# Patient Record
Sex: Female | Born: 2000 | Race: White | Hispanic: No | Marital: Single | Smoking: Never smoker
Health system: Southern US, Community
[De-identification: ages and names within clinical notes are randomized; demographics above are authoritative.]

## PROBLEM LIST (undated history)

## (undated) DIAGNOSIS — L309 Dermatitis, unspecified: Secondary | ICD-10-CM

## (undated) HISTORY — DX: Dermatitis, unspecified: L30.9

---
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
## 2018-12-18 ENCOUNTER — Ambulatory Visit: Payer: BC Managed Care – PPO | Admitting: Orthopedic Surgery

## 2020-09-29 ENCOUNTER — Ambulatory Visit (INDEPENDENT_AMBULATORY_CARE_PROVIDER_SITE_OTHER): Payer: BC Managed Care – PPO | Admitting: Allergy

## 2020-09-29 ENCOUNTER — Encounter: Payer: Self-pay | Admitting: Allergy

## 2020-09-29 ENCOUNTER — Other Ambulatory Visit: Payer: Self-pay

## 2020-09-29 VITALS — BP 122/82 | HR 105 | Temp 98.2°F | Ht 64.0 in | Wt 146.5 lb

## 2020-09-29 DIAGNOSIS — J3089 Other allergic rhinitis: Secondary | ICD-10-CM | POA: Diagnosis not present

## 2020-09-29 DIAGNOSIS — L2089 Other atopic dermatitis: Secondary | ICD-10-CM

## 2020-09-29 DIAGNOSIS — R12 Heartburn: Secondary | ICD-10-CM | POA: Diagnosis not present

## 2020-09-29 DIAGNOSIS — J45990 Exercise induced bronchospasm: Secondary | ICD-10-CM | POA: Diagnosis not present

## 2020-09-29 DIAGNOSIS — R21 Rash and other nonspecific skin eruption: Secondary | ICD-10-CM | POA: Diagnosis not present

## 2020-09-29 MED ORDER — CETIRIZINE HCL 10 MG PO TABS: 10.0000 mg | ORAL_TABLET | Freq: Every day | ORAL | 5 refills | Status: AC

## 2020-09-29 NOTE — Assessment & Plan Note (Signed)
Mild flares on antecubital fossa area at times.  Start proper skin care.

## 2020-09-29 NOTE — Assessment & Plan Note (Signed)
Rhinoconjunctivitis symptoms during the change of seasons for many years.  No prior allergy evaluation.  Today's skin prick testing showed: Positive to grass, ragweed, mold, dust mite, dog.  Borderline to weed pollen.    Will get bloodwork instead of intradermals due to her sensitive skin.  Start environmental control measures as below.  Use over the counter antihistamines such as Zyrtec (cetirizine), Claritin (loratadine), Allegra (fexofenadine), or Xyzal (levocetirizine) daily as needed. May take twice a day during allergy flares. May switch antihistamines every few months.

## 2020-09-29 NOTE — Patient Instructions (Addendum)
Today's skin testing showed: Positive to grass, ragweed, mold, dust mite, dog.  Borderline to weed pollen.  Negative to common foods. Results given.  Rash/itching: See below for proper skin care. Start zyrtec (cetirizine) 10mg  once a day. If symptoms are not controlled or causes drowsiness let know. Avoid the following potential triggers: alcohol, tight clothing, NSAIDs, hot showers and getting overheated. Get bloodwork:  We are ordering labs, so please allow 1-2 weeks for the results to come back. With the newly implemented Cures Act, the labs might be visible to you at the same time that they become visible to me. However, I will not address the results until all of the results are back, so please be patient.   Environmental allergies Start environmental control measures as below. Use over the counter antihistamines such as Zyrtec (cetirizine), Claritin (loratadine), Allegra (fexofenadine), or Xyzal (levocetirizine) daily as needed. May take twice a day during allergy flares. May switch antihistamines every few months.  Asthma May use albuterol rescue inhaler 2 puffs every 4 to 6 hours as needed for shortness of breath, chest tightness, coughing, and wheezing. May use albuterol rescue inhaler 2 puffs 5 to 15 minutes prior to strenuous physical activities. Monitor frequency of use.   Follow up in 2 months or sooner if needed.   Skin care recommendations  Bath time: Always use lukewarm water. AVOID very hot or cold water. Keep bathing time to 5-10 minutes. Do NOT use bubble bath. Use a mild soap and use just enough to wash the dirty areas. Do NOT scrub skin vigorously.  After bathing, pat dry your skin with a towel. Do NOT rub or scrub the skin.  Moisturizers and prescriptions:  ALWAYS apply moisturizers immediately after bathing (within 3 minutes). This helps to lock-in moisture. Use the moisturizer several times a day over the whole body. Good summer moisturizers include:  Aveeno, CeraVe, Cetaphil. Good winter moisturizers include: Aquaphor, Vaseline, Cerave, Cetaphil, Eucerin, Vanicream. When using moisturizers along with medications, the moisturizer should be applied about one hour after applying the medication to prevent diluting effect of the medication or moisturize around where you applied the medications. When not using medications, the moisturizer can be continued twice daily as maintenance.  Laundry and clothing: Avoid laundry products with added color or perfumes. Use unscented hypo-allergenic laundry products such as Tide free, Cheer free & gentle, and All free and clear.  If the skin still seems dry or sensitive, you can try double-rinsing the clothes. Avoid tight or scratchy clothing such as wool. Do not use fabric softeners or dyer sheets.  Reducing Pollen Exposure Pollen seasons: trees (spring), grass (summer) and ragweed/weeds (fall). Keep windows closed in your home and car to lower pollen exposure.  Install air conditioning in the bedroom and throughout the house if possible.  Avoid going out in dry windy days - especially early morning. Pollen counts are highest between 5 - 10 AM and on dry, hot and windy days.  Save outside activities for late afternoon or after a heavy rain, when pollen levels are lower.  Avoid mowing of grass if you have grass pollen allergy. Be aware that pollen can also be transported indoors on people and pets.  Dry your clothes in an automatic dryer rather than hanging them outside where they might collect pollen.  Rinse hair and eyes before bedtime.  Pet Allergen Avoidance: Contrary to popular opinion, there are no "hypoallergenic" breeds of dogs or cats. That is because people are not allergic to an animal's  hair, but to an allergen found in the animal's saliva, dander (dead skin flakes) or urine. Pet allergy symptoms typically occur within minutes. For some people, symptoms can build up and become most severe 8 to  12 hours after contact with the animal. People with severe allergies can experience reactions in public places if dander has been transported on the pet owners' clothing. Keeping an animal outdoors is only a partial solution, since homes with pets in the yard still have higher concentrations of animal allergens. Before getting a pet, ask your allergist to determine if you are allergic to animals. If your pet is already considered part of your family, try to minimize contact and keep the pet out of the bedroom and other rooms where you spend a great deal of time. As with dust mites, vacuum carpets often or replace carpet with a hardwood floor, tile or linoleum. High-efficiency particulate air (HEPA) cleaners can reduce allergen levels over time. While dander and saliva are the source of cat and dog allergens, urine is the source of allergens from rabbits, hamsters, mice and [place] pigs; so ask a non-allergic family member to clean the animal's cage. If you have a pet allergy, talk to your allergist about the potential for allergy immunotherapy (allergy shots). This strategy can often provide long-term relief. Control of House Dust Mite Allergen Dust mite allergens are a common trigger of allergy and asthma symptoms. While they can be found throughout the house, these microscopic creatures thrive in warm, humid environments such as bedding, upholstered furniture and carpeting. Because so much time is spent in the bedroom, it is essential to reduce mite levels there.  Encase pillows, mattresses, and box springs in special allergen-proof fabric covers or airtight, zippered plastic covers.  Bedding should be washed weekly in hot water (130 F) and dried in a hot dryer. Allergen-proof covers are available for comforters and pillows that can't be regularly washed.  Wash the allergy-proof covers every few months. Minimize clutter in the bedroom. Keep pets out of the bedroom.  Keep humidity less than 50% by  using a dehumidifier or air conditioning. You can buy a humidity measuring device called a hygrometer to monitor this.  If possible, replace carpets with hardwood, linoleum, or washable area rugs. If that's not possible, vacuum frequently with a vacuum that has a HEPA filter. Remove all upholstered furniture and non-washable window drapes from the bedroom. Remove all non-washable stuffed toys from the bedroom.  Wash stuffed toys weekly. Mold Control Mold and fungi can grow on a variety of surfaces provided certain temperature and moisture conditions exist.  Outdoor molds grow on plants, decaying vegetation and soil. The major outdoor mold, Alternaria and Cladosporium, are found in very high numbers during hot and dry conditions. Generally, a late summer - fall peak is seen for common outdoor fungal spores. Rain will temporarily lower outdoor mold spore count, but counts rise rapidly when the rainy period ends. The most important indoor molds are Aspergillus and Penicillium. Dark, humid and poorly ventilated basements are ideal sites for mold growth. The next most common sites of mold growth are the bathroom and the kitchen. Outdoor (Seasonal) Mold Control Use air conditioning and keep windows closed. Avoid exposure to decaying vegetation. Avoid leaf raking. Avoid grain handling. Consider wearing a face mask if working in moldy areas.  Indoor (Perennial) Mold Control  Maintain humidity below 50%. Get rid of mold growth on hard surfaces with water, detergent and, if necessary, 5% bleach (do not mix with  other cleaners). Then dry the area completely. If mold covers an area more than 10 square feet, consider hiring an indoor environmental professional. For clothing, washing with soap and water is best. If moldy items cannot be cleaned and dried, throw them away. Remove sources e.g. contaminated carpets. Repair and seal leaking roofs or pipes. Using dehumidifiers in damp basements may be helpful, but  empty the water and clean units regularly to prevent mildew from forming. All rooms, especially basements, bathrooms and kitchens, require ventilation and cleaning to deter mold and mildew growth. Avoid carpeting on concrete or damp floors, and storing items in damp areas.

## 2020-09-29 NOTE — Assessment & Plan Note (Addendum)
Exercise-induced asthma for 5 years.  Uses albuterol on rare occasions with good benefit.  Had COVID-19 in February 2022.  May use albuterol rescue inhaler 2 puffs every 4 to 6 hours as needed for shortness of breath, chest tightness, coughing, and wheezing. May use albuterol rescue inhaler 2 puffs 5 to 15 minutes prior to strenuous physical activities. Monitor frequency of use.

## 2020-09-29 NOTE — Assessment & Plan Note (Addendum)
Initially started with pruritus for 1 year and then noticed rash the past 3 months. Sometimes worse when around certain dogs and scratching the area. Benadryl helps.  . Today's skin prick testing showed: Positive to grass, ragweed, mold, dust mite, dog.  Borderline to weed pollen.  Negative to common foods. Patient has sensitive skin.   Based on results and timeline, possible that her new dog is contributing to her symptoms.  See below for proper skin care.  Start zyrtec (cetirizine) 10mg  once a day.  If symptoms are not controlled or causes drowsiness let know. . Avoid the following potential triggers: alcohol, tight clothing, NSAIDs, hot showers and getting overheated. . Get bloodwork to rule out other etiologies.

## 2020-09-29 NOTE — Progress Notes (Signed)
New Patient Note  RE: Shelia Hayes MRN: [id number] DOB: 2000-06-28 Date of Office Visit: 09/29/2020  Consult requested by: Shelia QuillBryan, Daniel J, PA-C Primary care provider: Etta QuillBryan, Daniel J, PA-C  Chief Complaint: Establish Care, Allergy Testing, and Urticaria  History of Present Illness: I had the pleasure of seeing Shelia Hayes for initial evaluation at the [facility] on 09/29/2020. She is a 20 y.o. female, who is self-referred here for the evaluation of itching and rash.  Patient has been itching for about 1 year which can occur anywhere on her body. She noticed then breaking out in rashes if she strokes her skin and at a times with no physical pressure the last 3 months.   Describes them as itchy, red, raised. Individual rashes lasts about 15-20 minutes. No ecchymosis upon resolution. Associated symptoms include: none. Suspected triggers are her brother's dog possibly. Denies any fevers, chills, changes in medications, foods, personal care products or recent infections. She has tried the following therapies: benadryl with good benefit. Currently on no daily meds.  Previous work up includes: no. Previous history of rash/hives: not sure.  Assessment and Plan: [name] is a 20 y.o. female with: Rash and other nonspecific skin eruption Initially started with pruritus for 1 year and then noticed rash the past 3 months. Sometimes worse when around certain dogs and scratching the area. Benadryl helps.  Today's skin prick testing showed: Positive to grass, ragweed, mold, dust mite, dog.  Borderline to weed pollen.  Negative to common foods. Patient has sensitive skin.  Based on results and timeline, possible that her new dog is contributing to her symptoms. See below for proper skin care. Start zyrtec (cetirizine) 10mg  once a day. If symptoms are not controlled or causes drowsiness let us know. Avoid the following potential triggers: alcohol, tight clothing, NSAIDs, hot  showers and getting overheated. Get bloodwork to rule out other etiologies.   Other allergic rhinitis Rhinoconjunctivitis symptoms during the change of seasons for many years.  No prior allergy evaluation. Today's skin prick testing showed: Positive to grass, ragweed, mold, dust mite, dog.  Borderline to weed pollen.   Will get bloodwork instead of intradermals due to her sensitive skin. Start environmental control measures as below. Use over the counter antihistamines such as Zyrtec (cetirizine), Claritin (loratadine), Allegra (fexofenadine), or Xyzal (levocetirizine) daily as needed. May take twice a day during allergy flares. May switch antihistamines every few months.  Mild exercise-induced asthma Exercise-induced asthma for 5 years.  Uses albuterol on rare occasions with good benefit.  Had COVID-19 in February 2022. May use albuterol rescue inhaler 2 puffs every 4 to 6 hours as needed for shortness of breath, chest tightness, coughing, and wheezing. May use albuterol rescue inhaler 2 puffs 5 to 15 minutes prior to strenuous physical activities. Monitor frequency of use.   Other atopic dermatitis Mild flares on antecubital fossa area at times. Start proper skin care.  Return in about 2 months (around 11/29/2020).  Meds ordered this encounter  Medications   cetirizine (ZYRTEC) 10 MG tablet    Sig: Take 1 tablet (10 mg total) by mouth daily.    Dispense:  30 tablet    Refill:  5    Lab Orders         Chronic Urticaria         CBC with Differential/Platelet         Allergens w/Total IgE Area 2         Alpha-Gal Panel  Comprehensive metabolic panel         Thyroid Cascade Profile         Tryptase         Sedimentation rate         ANA w/Reflex      Other allergy screening: Asthma: yes She reports symptoms of chest tightness, shortness of breath, coughing, wheezing for 5 years. Current medications include albuterol prn which help. She reports not using aerochamber with  inhalers. She tried the following inhalers: none. Main triggers are exercise. In the last month, frequency of symptoms: <1x/week. Frequency of nocturnal symptoms: 0x/month. Frequency of SABA use: <1x/week. Interference with physical activity: sometimes. Sleep is undisturbed. In the last 12 months, emergency room visits/urgent care visits/doctor office visits or hospitalizations due to respiratory issues: no. In the last 12 months, oral steroids courses: no. Lifetime history of hospitalization for respiratory issues: no. Prior intubations: no. History of pneumonia: no. She was not evaluated by allergist/pulmonologist in the past. Smoking exposure: no. Up to date with flu vaccine: no. Up to date with COVID-19 vaccine: no. Prior Covid-19 infection: February 2022. History of reflux: yes and does not take medications for this  Rhino conjunctivitis: yes She reports symptoms of itchy eyes, nasal congestion, rhinorrhea, sneezing. Symptoms have been going on for many years but worsening. The symptoms are present mainly during change of seasons. Other triggers include exposure to certain dog. Headache: no. She has used benadryl prn with fair improvement in symptoms. Sinus infections: no. Previous work up includes: none. Previous ENT evaluation: no. Last eye exam: In June 2022.  Food allergy: no Medication allergy: no Hymenoptera allergy: no Eczema:yes rarely on the antecubital fossa area.  History of recurrent infections suggestive of immunodeficency: no  Diagnostics: Skin Testing: Environmental allergy panel and select foods. Positive to grass, ragweed, mold, dust mite, dog.  Borderline to weed pollen.  Negative to common foods. Results discussed with patient/family.  Airborne Adult Perc - 09/29/20 0945     Time Antigen Placed 0945    Allergen Manufacturer Waynette Buttery    Location Back    Number of Test 59    Panel 1 Select    1. Control-Buffer 50% Glycerol Negative    2. Control-Histamine 1 mg/ml 2+     3. Albumin saline Negative    4. Bahia Negative    5. [place] 2+    6. Johnson 2+    7. Kentucky Blue 2+    8. Meadow Fescue Negative    9. Perennial Rye Negative    10. Sweet Vernal Negative    11. Timothy Negative    12. Cocklebur Negative    13. Burweed Marshelder Negative    14. Ragweed, short 2+    15. Ragweed, Giant 2+    16. Plantain,  English --   +/-   17. Lamb's Quarters --   +/-   18. Sheep Sorrell --   +/-   19. Rough Pigweed Negative    20. [place] Circle, Rough --   +/-   21. Mugwort, Common Negative    22. Ash mix Negative    23. Birch mix Negative    24. Beech American Negative    25. Box, Elder Negative    26. Cedar, red Negative    27. Cottonwood, Guinea-Bissau Negative    28. Elm mix Negative    29. Hickory Negative    30. Maple mix Negative    31. Oak, [place] mix Negative    32. Pecan  Pollen Negative    33. Pine mix Negative    34. Sycamore Eastern Negative    35. Walnut, Black Pollen Negative    36. Alternaria alternata Negative    37. Cladosporium Herbarum Negative    38. Aspergillus mix Negative    39. Penicillium mix Negative    40. Bipolaris sorokiniana (Helminthosporium) Negative    41. Drechslera spicifera (Curvularia) Negative    42. Mucor plumbeus Negative    43. Fusarium moniliforme Negative    44. Aureobasidium pullulans (pullulara) --   +/-   45. Rhizopus oryzae Negative    46. Botrytis cinera 2+    47. Epicoccum nigrum Negative    48. Phoma betae Negative    49. Candida Albicans 2+    50. Trichophyton mentagrophytes Negative    51. Mite, D Farinae  5,000 AU/ml Negative    52. Mite, D Pteronyssinus  5,000 AU/ml 2+    53. Cat Hair 10,000 BAU/ml Negative    54.  Dog Epithelia 2+    55. Mixed Feathers Negative    56. Horse Epithelia Negative    57. Cockroach, German Negative    58. Mouse Negative    59. Tobacco Leaf Negative             Food Perc - 09/29/20 0946       Test Information   Time Antigen Placed 1610    Allergen  Manufacturer Waynette Buttery    Location Back    Number of allergen test 10    Food Select      Food   1. Peanut Negative    2. Soybean food Negative    3. Wheat, whole Negative    4. Sesame Negative    5. Milk, cow Negative    6. Egg White, chicken Negative    7. Casein Negative    8. Shellfish mix Negative    9. Fish mix Negative    10. Cashew Negative             Past Medical History: Patient Active Problem List   Diagnosis Date Noted   Rash and other nonspecific skin eruption 09/29/2020   Other allergic rhinitis 09/29/2020   Mild exercise-induced asthma 09/29/2020   Other atopic dermatitis 09/29/2020    Past Medical History:  Diagnosis Date   Eczema    Past Surgical History: History reviewed. No pertinent surgical history. Medication List:  Current Outpatient Medications  Medication Sig Dispense Refill   cetirizine (ZYRTEC) 10 MG tablet Take 1 tablet (10 mg total) by mouth daily. 30 tablet 5   albuterol (VENTOLIN HFA) 108 (90 Base) MCG/ACT inhaler USE 2 INHALATIONS PRIOR TO EXERCISE AS DIRECTED (Patient not taking: Reported on 09/29/2020)     No current facility-administered medications for this visit.   Allergies: Not on File Social History: Social History   Socioeconomic History   Marital status: Single    Spouse name: Not on file   Number of children: Not on file   Years of education: Not on file   Highest education level: Not on file  Occupational History   Not on file  Tobacco Use   Smoking status: Never   Smokeless tobacco: Never  Substance and Sexual Activity   Alcohol use: Not on file   Drug use: Not on file   Sexual activity: Not on file  Other Topics Concern   Not on file  Social History Narrative   Not on file   Social Determinants of Health   Financial  Resource Strain: Not on file  Food Insecurity: Not on file  Transportation Needs: Not on file  Physical Activity: Not on file  Stress: Not on file  Social Connections: Not on file    Lives in a house. Smoking: denies. Occupation: daycare.  Environmental History: Water Damage/mildew in the house: no Carpet in the family room: no Carpet in the bedroom: no Heating: electric Cooling: central Pet: yes 1 dog x 8 months  Family History: Family History  Problem Relation Age of Onset   Asthma Mother    Eczema Mother    Eczema Sister    Asthma Brother    Review of Systems  Constitutional:  Negative for appetite change, chills, fever and unexpected weight change.  HENT:  Negative for congestion and rhinorrhea.   Eyes:  Negative for itching.  Respiratory:  Negative for cough, chest tightness, shortness of breath and wheezing.   Cardiovascular:  Negative for chest pain.  Gastrointestinal:  Negative for abdominal pain.  Genitourinary:  Negative for difficulty urinating.  Skin:  Positive for rash.  Allergic/Immunologic: Positive for environmental allergies. Negative for food allergies.  Neurological:  Negative for headaches.   Objective: BP 122/82   Pulse (!) 105   Temp 98.2 F (36.8 C) (Temporal)   Ht 5\' 4"  (1.626 m)   Wt 146 lb 8 oz (66.5 kg)   SpO2 98%   BMI 25.15 kg/m  Body mass index is 25.15 kg/m. Physical Exam Vitals and nursing note reviewed.  Constitutional:      Appearance: Normal appearance. She is well-developed.  HENT:     Head: Normocephalic and atraumatic.     Right Ear: Tympanic membrane and external ear normal.     Left Ear: Tympanic membrane and external ear normal.     Ears:     Comments: Right helix area - keloid appearing growth, apparently there for 2 years now.    Nose: Nose normal.     Mouth/Throat:     Mouth: Mucous membranes are moist.     Pharynx: Oropharynx is clear.  Eyes:     Conjunctiva/sclera: Conjunctivae normal.  Cardiovascular:     Rate and Rhythm: Normal rate and regular rhythm.     Heart sounds: Normal heart sounds. No murmur heard.   No friction rub. No gallop.  Pulmonary:     Effort: Pulmonary effort is  normal.     Breath sounds: Normal breath sounds. No wheezing, rhonchi or rales.  Musculoskeletal:     Cervical back: Neck supple.  Skin:    General: Skin is warm.     Findings: No rash.     Comments: No dermatographism on exam  Neurological:     Mental Status: She is alert and oriented to person, place, and time.  Psychiatric:        Behavior: Behavior normal.  The plan was reviewed with the patient/family, and all questions/concerned were addressed.  It was my pleasure to see Olimpia today and participate in her care. Please feel free to contact me with any questions or concerns.  Sincerely,  Lisabeth Pick, DO Allergy & Immunology  [facility] office: [phone] [facility] office: [phone]

## 2020-10-08 LAB — ALLERGENS W/TOTAL IGE AREA 2

## 2020-10-08 LAB — CBC WITH DIFFERENTIAL/PLATELET
Basophils Absolute: 0.1 10*3/uL (ref 0.0–0.2)
Basos: 1 %
EOS (ABSOLUTE): 0.1 10*3/uL (ref 0.0–0.4)
Eos: 2 %
Hematocrit: 52 % — ABNORMAL HIGH (ref 34.0–46.6)
Hemoglobin: 16.5 g/dL — ABNORMAL HIGH (ref 11.1–15.9)
Immature Grans (Abs): 0 10*3/uL (ref 0.0–0.1)
Immature Granulocytes: 0 %
Lymphocytes Absolute: 1.7 10*3/uL (ref 0.7–3.1)
Lymphs: 29 %
MCH: 27 pg (ref 26.6–33.0)
MCHC: 31.7 g/dL (ref 31.5–35.7)
MCV: 85 fL (ref 79–97)
Monocytes Absolute: 0.3 10*3/uL (ref 0.1–0.9)
Monocytes: 5 %
Neutrophils Absolute: 3.5 10*3/uL (ref 1.4–7.0)
Neutrophils: 63 %
Platelets: 305 10*3/uL (ref 150–450)
RBC: 6.12 x10E6/uL — ABNORMAL HIGH (ref 3.77–5.28)
RDW: 13.2 % (ref 11.7–15.4)
WBC: 5.6 10*3/uL (ref 3.4–10.8)

## 2020-10-08 LAB — COMPREHENSIVE METABOLIC PANEL
ALT: 10 IU/L (ref 0–32)
AST: 11 IU/L (ref 0–40)
Albumin/Globulin Ratio: 2.2 (ref 1.2–2.2)
Albumin: 5.1 g/dL — ABNORMAL HIGH (ref 3.9–5.0)
Alkaline Phosphatase: 100 IU/L (ref 44–121)
BUN/Creatinine Ratio: 15 (ref 9–23)
BUN: 10 mg/dL (ref 6–20)
Bilirubin Total: 0.2 mg/dL (ref 0.0–1.2)
CO2: 24 mmol/L (ref 20–29)
Calcium: 10.1 mg/dL (ref 8.7–10.2)
Chloride: 103 mmol/L (ref 96–106)
Creatinine, Ser: 0.66 mg/dL (ref 0.57–1.00)
Globulin, Total: 2.3 g/dL (ref 1.5–4.5)
Glucose: 100 mg/dL — ABNORMAL HIGH (ref 65–99)
Potassium: 4.4 mmol/L (ref 3.5–5.2)
Sodium: 143 mmol/L (ref 134–144)
Total Protein: 7.4 g/dL (ref 6.0–8.5)
eGFR: 128 mL/min/{1.73_m2} (ref >59)

## 2020-10-08 LAB — THYROID CASCADE PROFILE: TSH: 1.52 u[IU]/mL (ref 0.450–4.500)

## 2020-10-08 LAB — ANA W/REFLEX: Anti Nuclear Antibody (ANA): NEGATIVE

## 2020-10-08 LAB — ALPHA-GAL PANEL
Allergen Lamb IgE: <0.1 kU/L
Beef IgE: <0.1 kU/L
IgE (Immunoglobulin E), Serum: 94 IU/mL (ref 6–495)
O215-IgE Alpha-Gal: <0.1 kU/L
Pork IgE: <0.1 kU/L

## 2020-10-08 LAB — SEDIMENTATION RATE: Sed Rate: 5 mm/hr (ref 0–32)

## 2020-10-08 LAB — TRYPTASE: Tryptase: 2.8 ug/L (ref 2.2–13.2)

## 2020-10-08 LAB — CHRONIC URTICARIA: cu index: <6.7 (ref <10)

## 2020-12-01 ENCOUNTER — Ambulatory Visit: Payer: BC Managed Care – PPO | Admitting: Family

## 2020-12-01 DIAGNOSIS — J309 Allergic rhinitis, unspecified: Secondary | ICD-10-CM

## 2023-09-03 ENCOUNTER — Ambulatory Visit
Admission: RE | Admit: 2023-09-03 | Discharge: 2023-09-03 | Disposition: A | Discharge status: Home / Self Care | Payer: Self-pay | Source: Ambulatory Visit | Attending: Family Medicine | Admitting: Family Medicine

## 2023-09-03 VITALS — BP 120/72 | HR 84 | Temp 98.9°F | Resp 16

## 2023-09-03 DIAGNOSIS — M79661 Pain in right lower leg: Secondary | ICD-10-CM | POA: Diagnosis not present

## 2023-09-03 DIAGNOSIS — S86811A Strain of other muscle(s) and tendon(s) at lower leg level, right leg, initial encounter: Secondary | ICD-10-CM

## 2023-09-03 MED ORDER — ACETAMINOPHEN 325 MG PO TABS: 650.0000 mg | ORAL_TABLET | Freq: Four times a day (QID) | ORAL | 0 refills | Status: AC | PRN

## 2023-09-03 MED ORDER — CYCLOBENZAPRINE HCL 5 MG PO TABS: 5.0000 mg | ORAL_TABLET | Freq: Every evening | ORAL | 0 refills | Status: AC | PRN

## 2023-09-03 NOTE — ED Provider Notes (Signed)
 The person you are trying to reach is not avail [facility]  Note:  This document was prepared using Conservation officer, historic buildings and may include unintentional dictation errors.  MRN: [id number] DOB: 2000-12-18  Subjective:   Shelia Hayes is a 23 y.o. female presenting for 1 week history of persistent right calf pain.  Reports that it can be very positional.  Symptoms started after she started working out, running.  She has since stopped but the pain continues.  No long distance travel, hospitalizations, immobility, cancer, birth control and smoking, recent surgery or trauma.  No history of clotting disorders.  Patient's primary goal is to rule out DVT.  No current facility-administered medications for this encounter.  Current Outpatient Medications:    sertraline (ZOLOFT) 100 MG tablet, , Disp: , Rfl:    albuterol (VENTOLIN HFA) 108 (90 Base) MCG/ACT inhaler, USE 2 INHALATIONS PRIOR TO EXERCISE AS DIRECTED (Patient not taking: Reported on 09/29/2020), Disp: , Rfl:    cetirizine  (ZYRTEC ) 10 MG tablet, Take 1 tablet (10 mg total) by mouth daily., Disp: 30 tablet, Rfl: 5   No Known Allergies  Past Medical History:  Diagnosis Date   Eczema      History reviewed. No pertinent surgical history.  Family History  Problem Relation Age of Onset   Asthma Mother    Eczema Mother    Eczema Sister    Asthma Brother     Social History   Tobacco Use   Smoking status: Never   Smokeless tobacco: Never    ROS   Objective:   Vitals: BP 120/72 (BP Location: Right Arm)   Pulse 84   Temp 98.9 F (37.2 C) (Oral)   Resp 16   LMP 09/02/2023 (Exact Date)   SpO2 97%   Physical Exam Constitutional:      General: She is not in acute distress.    Appearance: Normal appearance. She is well-developed. She is not ill-appearing, toxic-appearing or diaphoretic.  HENT:     Head: Normocephalic and atraumatic.     Nose: Nose normal.     Mouth/Throat:      Mouth: Mucous membranes are moist.  Eyes:     General: No scleral icterus.       Right eye: No discharge.        Left eye: No discharge.     Extraocular Movements: Extraocular movements intact.  Cardiovascular:     Rate and Rhythm: Normal rate.  Pulmonary:     Effort: Pulmonary effort is normal.  Musculoskeletal:     Right lower leg: Tenderness (laterally and medially) present. No swelling, deformity, lacerations or bony tenderness. No edema.  Skin:    General: Skin is warm and dry.  Neurological:     General: No focal deficit present.     Mental Status: She is alert and oriented to person, place, and time.  Psychiatric:        Mood and Affect: Mood normal.        Behavior: Behavior normal.     Assessment and Plan :   PDMP not reviewed this encounter.  1. Strain of calf muscle, right, initial encounter   2. Pain in right lower leg    Primary suspicion is for calf strain but as patient has concerns for DVT, will order an outpatient ultrasound.  Recommend follow-up with Ortho.  Use Tylenol , muscle relaxant.  Counseled patient on potential for adverse effects with medications prescribed/recommended today, ER and return-to-clinic precautions discussed,  patient verbalized understanding.    Christopher Savannah, PA-C 09/03/23 1409

## 2023-09-03 NOTE — Discharge Instructions (Addendum)
 I have placed an order for an ultrasound to rule out a blood clot in your right lower leg.  This can only be accomplished by going to the [facility] emergency room at 11 AM [facility].  Please tell the check-in staff that we have placed orders for you to have this done as an outpatient ultrasound.  In the meantime, for a calf strain, you can use Tylenol  and a muscle relaxant.  Make sure to follow-up with an orthopedist early next week.

## 2023-09-03 NOTE — ED Triage Notes (Signed)
 Pt states right calf pain for the past week.

## 2023-09-04 ENCOUNTER — Ambulatory Visit (HOSPITAL_COMMUNITY)
Admission: RE | Admit: 2023-09-04 | Discharge: 2023-09-04 | Disposition: A | Discharge status: Home / Self Care | Source: Ambulatory Visit | Attending: Urgent Care

## 2023-09-04 ENCOUNTER — Ambulatory Visit: Payer: Self-pay | Admitting: Urgent Care

## 2023-09-04 DIAGNOSIS — M79661 Pain in right lower leg: Secondary | ICD-10-CM | POA: Diagnosis present
